# Patient Record
Sex: Female | Born: 1990 | Hispanic: Yes | Marital: Single | Smoking: Never smoker
Health system: Southern US, Community
[De-identification: ages and names within clinical notes are randomized; demographics above are authoritative.]

## PROBLEM LIST (undated history)

## (undated) ENCOUNTER — Inpatient Hospital Stay (HOSPITAL_COMMUNITY): Payer: Self-pay

## (undated) DIAGNOSIS — I10 Essential (primary) hypertension: Secondary | ICD-10-CM

## (undated) HISTORY — PX: APPENDECTOMY: SHX54

---

## 2016-09-28 ENCOUNTER — Encounter (HOSPITAL_COMMUNITY): Payer: Self-pay | Admitting: Emergency Medicine

## 2016-09-28 ENCOUNTER — Emergency Department (HOSPITAL_COMMUNITY)
Admission: EM | Admit: 2016-09-28 | Discharge: 2016-09-28 | Disposition: A | Discharge status: Home / Self Care | Payer: Self-pay | Attending: Dermatology | Admitting: Dermatology

## 2016-09-28 DIAGNOSIS — R1032 Left lower quadrant pain: Secondary | ICD-10-CM | POA: Insufficient documentation

## 2016-09-28 DIAGNOSIS — Z5321 Procedure and treatment not carried out due to patient leaving prior to being seen by health care provider: Secondary | ICD-10-CM | POA: Insufficient documentation

## 2016-09-28 DIAGNOSIS — I10 Essential (primary) hypertension: Secondary | ICD-10-CM | POA: Insufficient documentation

## 2016-09-28 HISTORY — DX: Essential (primary) hypertension: I10

## 2016-09-28 NOTE — ED Triage Notes (Signed)
Pt c/o L sided groin pain that started after picking up gallon jugs. Pt unsure if she is pregnant.

## 2016-09-29 ENCOUNTER — Encounter (HOSPITAL_COMMUNITY): Payer: Self-pay | Admitting: Emergency Medicine

## 2016-09-29 ENCOUNTER — Emergency Department (HOSPITAL_COMMUNITY)
Admission: EM | Admit: 2016-09-29 | Discharge: 2016-09-29 | Discharge status: Against Medical Advice | Payer: Self-pay | Attending: Emergency Medicine | Admitting: Emergency Medicine

## 2016-09-29 ENCOUNTER — Emergency Department (HOSPITAL_COMMUNITY): Payer: Self-pay

## 2016-09-29 DIAGNOSIS — R102 Pelvic and perineal pain: Secondary | ICD-10-CM | POA: Insufficient documentation

## 2016-09-29 DIAGNOSIS — O26899 Other specified pregnancy related conditions, unspecified trimester: Secondary | ICD-10-CM

## 2016-09-29 DIAGNOSIS — I10 Essential (primary) hypertension: Secondary | ICD-10-CM | POA: Insufficient documentation

## 2016-09-29 DIAGNOSIS — Z3A01 Less than 8 weeks gestation of pregnancy: Secondary | ICD-10-CM | POA: Insufficient documentation

## 2016-09-29 DIAGNOSIS — O3680X0 Pregnancy with inconclusive fetal viability, not applicable or unspecified: Secondary | ICD-10-CM

## 2016-09-29 DIAGNOSIS — O26891 Other specified pregnancy related conditions, first trimester: Secondary | ICD-10-CM | POA: Insufficient documentation

## 2016-09-29 LAB — CBC WITH DIFFERENTIAL/PLATELET
BASOS ABS: 0 10*3/uL (ref 0.0–0.1)
Basophils Relative: 0 %
Eosinophils Absolute: 0.1 10*3/uL (ref 0.0–0.7)
Eosinophils Relative: 1 %
HEMATOCRIT: 38.2 % (ref 36.0–46.0)
Hemoglobin: 12.5 g/dL (ref 12.0–15.0)
LYMPHS ABS: 2.5 10*3/uL (ref 0.7–4.0)
LYMPHS PCT: 28 %
MCH: 27.9 pg (ref 26.0–34.0)
MCHC: 32.7 g/dL (ref 30.0–36.0)
MCV: 85.3 fL (ref 78.0–100.0)
MONO ABS: 0.5 10*3/uL (ref 0.1–1.0)
Monocytes Relative: 5 %
NEUTROS ABS: 6.1 10*3/uL (ref 1.7–7.7)
Neutrophils Relative %: 66 %
Platelets: 258 10*3/uL (ref 150–400)
RBC: 4.48 MIL/uL (ref 3.87–5.11)
RDW: 14.2 % (ref 11.5–15.5)
WBC: 9.1 10*3/uL (ref 4.0–10.5)

## 2016-09-29 LAB — BASIC METABOLIC PANEL
ANION GAP: 6 (ref 5–15)
BUN: 13 mg/dL (ref 6–20)
CHLORIDE: 104 mmol/L (ref 101–111)
CO2: 26 mmol/L (ref 22–32)
Calcium: 8.9 mg/dL (ref 8.9–10.3)
Creatinine, Ser: 0.61 mg/dL (ref 0.44–1.00)
GFR calc non Af Amer: >60 mL/min (ref >60)
GLUCOSE: 91 mg/dL (ref 65–99)
Potassium: 4 mmol/L (ref 3.5–5.1)
Sodium: 136 mmol/L (ref 135–145)

## 2016-09-29 LAB — URINALYSIS, ROUTINE W REFLEX MICROSCOPIC
BILIRUBIN URINE: NEGATIVE
Glucose, UA: NEGATIVE mg/dL
HGB URINE DIPSTICK: NEGATIVE
KETONES UR: NEGATIVE mg/dL
Leukocytes, UA: NEGATIVE
Nitrite: NEGATIVE
PROTEIN: NEGATIVE mg/dL
SPECIFIC GRAVITY, URINE: 1.025 (ref 1.005–1.030)
pH: 6 (ref 5.0–8.0)

## 2016-09-29 LAB — WET PREP, GENITAL
SPERM: NONE SEEN
TRICH WET PREP: NONE SEEN
Yeast Wet Prep HPF POC: NONE SEEN

## 2016-09-29 LAB — ABO/RH: ABO/RH(D): O POS

## 2016-09-29 LAB — HCG, QUANTITATIVE, PREGNANCY: hCG, Beta Chain, Quant, S: 11796 m[IU]/mL — ABNORMAL HIGH (ref <5)

## 2016-09-29 LAB — POC URINE PREG, ED: PREG TEST UR: POSITIVE — AB

## 2016-09-29 NOTE — ED Notes (Signed)
Pt children given snacks.

## 2016-09-29 NOTE — ED Notes (Signed)
Pt explained that Dr. Despina HiddenEure was in a procedure at this time and was not able to perform consult until finished with procedure.  Dr. Patria Maneampos notified of pts concern for having to wait.

## 2016-09-29 NOTE — ED Provider Notes (Signed)
[facility] DEPT Provider Note   CSN: [id number] Arrival date & time: 09/29/16  4098     History   Chief Complaint Chief Complaint  Patient presents with  . Pelvic Pain    HPI Laura Bradford is a 25 y.o. female G2P1 presenting with lower left and midline pelvic pain along with yellow vaginal discharge and dysuria x 1 week.  She is pregnant as evidenced by 3 positive home pregnancy tests, her lmp was 08/05/16, although had a 2 day episode of heavy spotting in early September. She has not yet established prenatal care but plans to use the health department.  She reports her pelvic pain is constant, waxing and waning and has been present for the past week.  She believes lifting a heavy object may have triggered the start of pain. She denies fevers, chills, vomiting but endorses nausea and also feels lightheaded.    The history is provided by the patient. The history is limited by a language barrier (friend at bedside as interpreter as skype interpreter service not working.). A language interpreter was used.    Past Medical History:  Diagnosis Date  . Hypertension     There are no active problems to display for this patient.   Past Surgical History:  Procedure Laterality Date  . APPENDECTOMY    . CESAREAN SECTION      OB History    Gravida Para Term Preterm AB Living   1 1   1        SAB TAB Ectopic Multiple Live Births                   Home Medications    Prior to Admission medications   Medication Sig Start Date End Date Taking? Authorizing Provider  acetaminophen (TYLENOL) 500 MG tablet Take 500 mg by mouth every 6 (six) hours as needed.   Yes Historical Provider, MD    Family History No family history on file.  Social History Social History  Substance Use Topics  . Smoking status: Never Smoker  . Smokeless tobacco: Never Used  . Alcohol use No     Allergies   Review of patient's allergies indicates no known allergies.   Review of Systems Review  of Systems  Constitutional: Negative for fever.  HENT: Negative for congestion and sore throat.   Eyes: Negative.   Respiratory: Negative for chest tightness and shortness of breath.   Cardiovascular: Negative for chest pain.  Gastrointestinal: Positive for nausea. Negative for abdominal pain and vomiting.  Genitourinary: Positive for dysuria, pelvic pain and vaginal discharge. Negative for hematuria.  Musculoskeletal: Negative for arthralgias, joint swelling and neck pain.  Skin: Negative.  Negative for rash and wound.  Neurological: Negative for dizziness, weakness, light-headedness, numbness and headaches.  Psychiatric/Behavioral: Negative.      Physical Exam Updated Vital Signs BP 115/63 (BP Location: Left Arm)   Pulse 75   Temp 97.8 F (36.6 C) (Oral)   Resp 18   LMP 08/05/2016 Comment: spotted a couple of days on 9/10  SpO2 100%   Physical Exam  Constitutional: She appears well-developed and well-nourished.  HENT:  Head: Normocephalic and atraumatic.  Eyes: Conjunctivae are normal.  Neck: Normal range of motion.  Cardiovascular: Normal rate, regular rhythm, normal heart sounds and intact distal pulses.   Pulmonary/Chest: Effort normal and breath sounds normal. She has no wheezes.  Abdominal: Soft. Bowel sounds are normal. There is no tenderness.  Genitourinary: Vagina normal and uterus normal. Uterus is not tender.  Cervix exhibits no motion tenderness and no discharge. Right adnexum displays no mass, no tenderness and no fullness. Left adnexum displays tenderness. Left adnexum displays no mass and no fullness. No bleeding in the vagina. No vaginal discharge found.  Musculoskeletal: Normal range of motion.  Neurological: She is alert.  Skin: Skin is warm and dry.  Psychiatric: She has a normal mood and affect.  Nursing note and vitals reviewed.    ED Treatments / Results  Labs (all labs ordered are listed, but only abnormal results are displayed) Labs Reviewed  WET  PREP, GENITAL - Abnormal; Notable for the following:       Result Value   Clue Cells Wet Prep HPF POC PRESENT (*)    WBC, Wet Prep HPF POC MANY (*)    All other components within normal limits  HCG, QUANTITATIVE, PREGNANCY - Abnormal; Notable for the following:    hCG, Beta Chain, Quant, S 11,796 (*)    All other components within normal limits  URINALYSIS, ROUTINE W REFLEX MICROSCOPIC (NOT AT [facility]) - Abnormal; Notable for the following:    APPearance HAZY (*)    All other components within normal limits  POC URINE PREG, ED - Abnormal; Notable for the following:    Preg Test, Ur POSITIVE (*)    All other components within normal limits  CBC WITH DIFFERENTIAL/PLATELET  BASIC METABOLIC PANEL  HIV ANTIBODY (ROUTINE TESTING)  ABO/RH  GC/CHLAMYDIA PROBE AMP ([place]) NOT AT [facility]    EKG  EKG Interpretation None       Radiology [place] Ob Comp Less 14 Wks  Result Date: 09/29/2016 CLINICAL DATA:  Pelvic pain. EXAM: OBSTETRIC <14 WK US AND TRANSVAGINAL OB US TECHNIQUE: Both transabdominal and transvaginal ultrasound examinations were performed for complete evaluation of the gestation as well as the maternal uterus, adnexal regions, and pelvic cul-de-sac. Transvaginal technique was performed to assess early pregnancy. COMPARISON:  No prior. FINDINGS: Intrauterine gestational sac: Single Yolk sac:  Present Embryo:  Not visualized Cardiac Activity: Not visualized CRL:  0.9 cm 5 w   5 d                  US EDC: 05/27/2017 Subchorionic hemorrhage:  None visualized. Maternal uterus/adnexae: 2.2 x 2.1 x 2.2 cm complex right ovarian/ adnexal mass with associated blood flow. This may represent a large corpus luteal cyst, ectopic pregnancy, or ovarian/ paraovarian mass other etiology. Clinical in short-term interval ultrasound follow-up is suggested. No free pelvic fluid . IMPRESSION: 1. Probable early intrauterine pregnancy. Probable early intrauterine gestational sac, but no fetal pole, or cardiac  activity yet visualized. Recommend follow-up quantitative B-HCG levels and follow-up US in 14 days to confirm and assess viability. This recommendation follows SRU consensus guidelines: Diagnostic Criteria for Nonviable Pregnancy Early in the First Trimester. Malva Limes Engl J Med 2013; 161:0960-45369:1443-51. 2. 2.2 x 2.1 x 2.2 cm complex right ovarian/adnexal mass with associated blood flow. This may represent a PICC large corpus luteal cyst, ectopic pregnancy, or ovarian/paraovarian mass of other etiology. Clinical and short-term interval ultrasound follow-up suggested. Electronically Signed   By: Maisie Fushomas  Register   On: 09/29/2016 11:06   [place] Ob Transvaginal  Result Date: 09/29/2016 CLINICAL DATA:  Pelvic pain. EXAM: OBSTETRIC <14 WK US AND TRANSVAGINAL OB US TECHNIQUE: Both transabdominal and transvaginal ultrasound examinations were performed for complete evaluation of the gestation as well as the maternal uterus, adnexal regions, and pelvic cul-de-sac. Transvaginal technique was performed to assess early pregnancy. COMPARISON:  No prior.  FINDINGS: Intrauterine gestational sac: Single Yolk sac:  Present Embryo:  Not visualized Cardiac Activity: Not visualized CRL:  0.9 cm 5 w   5 d                  [place] EDC: 05/27/2017 Subchorionic hemorrhage:  None visualized. Maternal uterus/adnexae: 2.2 x 2.1 x 2.2 cm complex right ovarian/ adnexal mass with associated blood flow. This may represent a large corpus luteal cyst, ectopic pregnancy, or ovarian/ paraovarian mass other etiology. Clinical in short-term interval ultrasound follow-up is suggested. No free pelvic fluid . IMPRESSION: 1. Probable early intrauterine pregnancy. Probable early intrauterine gestational sac, but no fetal pole, or cardiac activity yet visualized. Recommend follow-up quantitative B-HCG levels and follow-up US in 14 days to confirm and assess viability. This recommendation follows SRU consensus guidelines: Diagnostic Criteria for Nonviable Pregnancy Early in the  First Trimester. Malva Limes Med 2013; 045:4098-11. 2. 2.2 x 2.1 x 2.2 cm complex right ovarian/adnexal mass with associated blood flow. This may represent a PICC large corpus luteal cyst, ectopic pregnancy, or ovarian/paraovarian mass of other etiology. Clinical and short-term interval ultrasound follow-up suggested. Electronically Signed   By: Maisie Fus  Register   On: 09/29/2016 11:06    Procedures Procedures (including critical care time)  Medications Ordered in ED Medications - No data to display   Initial Impression / Assessment and Plan / ED Course  I have reviewed the triage vital signs and the nursing notes.  Pertinent labs & imaging results that were available during my care of the patient were reviewed by me and considered in my medical decision making (see chart for details).  Clinical Course    Labs and [place] reviewed and call placed to Dr. Despina Hidden who was currently unavailable but would review the [place] and call back with recommendations.  Pt was very anxious about leaving and discussed with her several times re possible concern for ectopic pregnancy and that we need Dr. Forestine Chute advice prior to safely dispositioning to home.  After several conversations, pt and her friend left ama. She was advised to return here for any worsened sx, and recommended f/u with [facility] for a recheck.  Friend stated she would take her there tomorrow.  About 5 minutes after this,  Dr. Despina Hidden confirmed intrauterine pregnancy with yolk sac, fetal pole not yet present.  The right mass is definitely a hemorrhagic ovarian cyst, not an ectopic.  He advised repeat quant beta hcg in 3 days to confirm ongoing viable pregnancy.  Attempt to contact pt - voice mail, message left regarding calling me back regarding Dr.Eure's recs.   Final Clinical Impressions(s) / ED Diagnoses   Final diagnoses:  Pregnancy of unknown anatomic location    New Prescriptions Discharge Medication List as of 09/29/2016  2:14 PM         Burgess Amor, PA-C 09/29/16 1934    Azalia Bilis, MD 10/02/16 1023

## 2016-09-29 NOTE — ED Triage Notes (Addendum)
Pt reports pelvic pain x 1 week. Positive home pregnancy test x 3. Also reports dizziness/n. LMP-08/05/16-but reports spotting for 2 days on 08/23/16.

## 2016-09-29 NOTE — ED Notes (Addendum)
Pt children given snacks and books to read. Pt informed of delay.

## 2016-09-29 NOTE — ED Notes (Addendum)
Burgess AmorJulie idol, PA at bedside speaking to pt about delay.

## 2016-09-29 NOTE — ED Notes (Signed)
Pt left ama, even after counseling from doctor and PA discussing risks of leaving ama.

## 2016-09-29 NOTE — ED Notes (Signed)
Burgess AmorJulie Idol, PA notified that pt was getting anxious and wanted to speak to her.

## 2016-09-29 NOTE — ED Notes (Addendum)
Pt not found in room at this time, Dr. Patria Maneampos and Burgess AmorJulie Idol, PA notified. Pt will be marked as leaving AMA.

## 2016-09-30 LAB — GC/CHLAMYDIA PROBE AMP (~~LOC~~) NOT AT ARMC
Chlamydia: NEGATIVE
Neisseria Gonorrhea: NEGATIVE

## 2016-09-30 LAB — HIV ANTIBODY (ROUTINE TESTING W REFLEX): HIV Screen 4th Generation wRfx: NONREACTIVE

## 2016-10-05 ENCOUNTER — Inpatient Hospital Stay (HOSPITAL_COMMUNITY)
Admission: AD | Admit: 2016-10-05 | Discharge: 2016-10-05 | Disposition: A | Discharge status: Home / Self Care | Payer: Self-pay | Source: Ambulatory Visit | Attending: Family Medicine | Admitting: Family Medicine

## 2016-10-05 ENCOUNTER — Encounter (HOSPITAL_COMMUNITY): Payer: Self-pay | Admitting: *Deleted

## 2016-10-05 ENCOUNTER — Inpatient Hospital Stay (HOSPITAL_COMMUNITY): Payer: Self-pay

## 2016-10-05 DIAGNOSIS — O418X10 Other specified disorders of amniotic fluid and membranes, first trimester, not applicable or unspecified: Secondary | ICD-10-CM

## 2016-10-05 DIAGNOSIS — O208 Other hemorrhage in early pregnancy: Secondary | ICD-10-CM | POA: Insufficient documentation

## 2016-10-05 DIAGNOSIS — Z3A01 Less than 8 weeks gestation of pregnancy: Secondary | ICD-10-CM | POA: Insufficient documentation

## 2016-10-05 DIAGNOSIS — O468X1 Other antepartum hemorrhage, first trimester: Secondary | ICD-10-CM

## 2016-10-05 DIAGNOSIS — O26899 Other specified pregnancy related conditions, unspecified trimester: Secondary | ICD-10-CM

## 2016-10-05 DIAGNOSIS — R109 Unspecified abdominal pain: Secondary | ICD-10-CM | POA: Insufficient documentation

## 2016-10-05 LAB — URINE MICROSCOPIC-ADD ON

## 2016-10-05 LAB — URINALYSIS, ROUTINE W REFLEX MICROSCOPIC
Bilirubin Urine: NEGATIVE
GLUCOSE, UA: NEGATIVE mg/dL
Hgb urine dipstick: NEGATIVE
Ketones, ur: NEGATIVE mg/dL
Nitrite: NEGATIVE
PH: 6 (ref 5.0–8.0)
PROTEIN: NEGATIVE mg/dL
Specific Gravity, Urine: 1.025 (ref 1.005–1.030)

## 2016-10-05 LAB — COMPREHENSIVE METABOLIC PANEL
ALK PHOS: 51 U/L (ref 38–126)
ALT: 31 U/L (ref 14–54)
AST: 21 U/L (ref 15–41)
Albumin: 3.6 g/dL (ref 3.5–5.0)
Anion gap: 6 (ref 5–15)
BILIRUBIN TOTAL: 0.4 mg/dL (ref 0.3–1.2)
BUN: 10 mg/dL (ref 6–20)
CO2: 26 mmol/L (ref 22–32)
Calcium: 8.6 mg/dL — ABNORMAL LOW (ref 8.9–10.3)
Chloride: 105 mmol/L (ref 101–111)
Creatinine, Ser: 0.61 mg/dL (ref 0.44–1.00)
GLUCOSE: 92 mg/dL (ref 65–99)
POTASSIUM: 3.7 mmol/L (ref 3.5–5.1)
Sodium: 137 mmol/L (ref 135–145)
TOTAL PROTEIN: 6.8 g/dL (ref 6.5–8.1)

## 2016-10-05 LAB — CBC
HEMATOCRIT: 33.5 % — AB (ref 36.0–46.0)
HEMOGLOBIN: 11.2 g/dL — AB (ref 12.0–15.0)
MCH: 27.9 pg (ref 26.0–34.0)
MCHC: 33.4 g/dL (ref 30.0–36.0)
MCV: 83.3 fL (ref 78.0–100.0)
Platelets: 221 10*3/uL (ref 150–400)
RBC: 4.02 MIL/uL (ref 3.87–5.11)
RDW: 14.4 % (ref 11.5–15.5)
WBC: 8 10*3/uL (ref 4.0–10.5)

## 2016-10-05 LAB — HCG, QUANTITATIVE, PREGNANCY: HCG, BETA CHAIN, QUANT, S: 38809 m[IU]/mL — AB (ref <5)

## 2016-10-05 MED ORDER — PRENATAL VITAMINS 28-0.8 MG PO TABS: 1.0000 | ORAL_TABLET | Freq: Every day | ORAL | 3 refills | Status: AC

## 2016-10-05 NOTE — Discharge Instructions (Signed)
Dolor abdominal en el embarazo (Abdominal Pain During Pregnancy) El dolor abdominal es frecuente durante el embarazo. Generalmente no causa ningn dao. El dolor abdominal puede tener numerosas causas. Algunas causas son ms graves que otras. Ciertas causas de dolor abdominal durante el embarazo se diagnostican fcilmente. A veces, se tarda un tiempo para llegar al diagnstico. Otras veces la causa no se conoce. El dolor abdominal puede estar relacionado con [place]una alteracin del Rinconembarazo, o puede deberse a una causa totalmente diferente. Por este motivo, siempre consulte a su mdico cuando sienta molestias abdominales. INSTRUCCIONES PARA EL CUIDADO EN EL HOGAR  Est atenta al dolor para ver si hay cambios. Las siguientes indicaciones ayudarn a Psychologist, educationalaliviar cualquier Longs Drug Storesmolestia que pueda sentir:  No Chiropodisttenga relaciones sexuales y no coloque nada dentro de la vagina hasta que los sntomas hayan desaparecido completamente.  Descanse todo lo que pueda RadioShackhasta que el dolor se le haya calmado.  Si siente nuseas, beba lquidos claros. Evite los alimentos slidos mientras sienta malestar o tenga nuseas.  Tome slo medicamentos de venta libre o recetados, segn las indicaciones del mdico.  Cumpla con todas las visitas de control, segn le indique su mdico. SOLICITE ATENCIN MDICA DE INMEDIATO SI:  Tiene un sangrado, prdida de lquidos o elimina tejidos por la vagina.  El dolor o los clicos West Bountifulaumentan.  Tiene vmitos persistentes.  Comienza a Financial risk analystsentir dolor al orinar u Centex Corporationobserva sangre.  Tiene fiebre.  Nota que los movimientos del beb disminuyen.  Siente intensa debilidad o se marea.  Tiene dificultad para respirar con o sin dolor abdominal.  Siente un dolor de cabeza intenso junto al dolor abdominal.  Shelle Ironiene una secrecin vaginal anormal con dolor abdominal.  Tiene diarrea persistente.  El dolor abdominal sigue o empeora an despus de Field seismologisthacer reposo. ASEGRESE DE QUE:   Comprende estas  instrucciones.  Controlar su afeccin.  Recibir ayuda de inmediato si no mejora o si empeora.   Esta informacin no tiene Theme park managercomo fin reemplazar el consejo del mdico. Asegrese de hacerle al mdico cualquier pregunta que tenga.   Document Released: 11/30/2005 Document Revised: 09/20/2013 Elsevier Interactive Patient Education 2016 Elsevier Inc. Hematoma subcorinico (Subchorionic Hematoma) Un hematoma subcorinico es una acumulacin de sangre entre la pared externa de la placenta y la pared interna del la matriz (tero). La placenta es el rgano que conecta el feto a la pared del tero. La placenta realiza la funcin de alimentacin, respiracin (oxgeno al feto) y el trabajo de eliminacin de desechos (excrecin) del feto.  Un hematoma subcorinico es la anormalidad ms frecuente encontrada en una ecografa durante el primer trimestre o principios del segundo trimestre del embarazo. Si ha habido poca o ninguna hemorragia vaginal, generalmente los pequeos hematomas se reducen por su propia cuenta y no afectan al beb ni al Vanetta Muldersembarazo. La sangre es absorbida gradualmente durante una o Ravendendos semanas. Cuando la hemorragia comienza ms tarde en el embarazo o el hematoma es ms grande o se produce en una paciente de edad avanzada, el resultado puede no ser tan bueno. Los grandes hematomas pueden agrandarse an ms y Lesothoaumenta las posibilidades de aborto espontneo. El hematoma subcorinico tambin aumenta el riesgo de desprendimiento precoz de la placenta del tero, muerte fetal y Sport and exercise psychologistparto prematuro. INSTRUCCIONES PARA EL CUIDADO EN EL HOGAR   Repose en cama si el mdico se lo recomienda. Aunque el reposo en cama no evitar la hemorragia o un aborto espontneo, su mdico puede recomendarlo.  Evite levantar objetos pesados (ms de 10 libras [4,5 kg]), hacer  ejercicio, tener relaciones sexuales o realizar duchas vaginales segn se lo indique el profesional.  Lleve un registro de la cantidad y Hydrographic surveyor de remojo  (saturacin) de las toallas higinicas que Landscape architect. Anote esta informacin.  No use tampones.  Cumpla con todas las visitas de control, segn le indique su mdico. El profesional podr pedirle que se realice anlisis de seguimiento, pruebas de [place] o [place]. SOLICITE ATENCIN MDICA DE INMEDIATO SI:   Siente calambres intensos en el estmago, en la espalda, en el abdomen o en la pelvis.  Tiene fiebre.  Elimina cogulos o tejidos grandes. Guarde los tejidos para que su mdico los vea.  Si la hemorragia aumenta o siente mareos, debilidad o tiene episodios de [place].   Esta informacin no tiene Theme park manager el consejo del mdico. Asegrese de hacerle al mdico cualquier pregunta que tenga.   Document Released: 03/18/2009 Document Revised: 09/20/2013 Elsevier Interactive Patient Education [employer]

## 2016-10-05 NOTE — MAU Note (Signed)
Patient [place] was [place] in [place] Tuesday and was told she had an ectopic pregnancy and was told to come to MAU.  She did not have childcare until now so she presents today with continued pain on LL side of abdomen.  Denies vaginal bleeding.

## 2016-10-05 NOTE — MAU Provider Note (Signed)
History     CSN: [id number]  Arrival date and time: 10/05/16 4492   First Provider Initiated Contact with Patient 10/05/16 [phone]      Chief Complaint  Patient presents with  . aborting   HPI   In house spanish interpretor used:  Laura Bradford is a 25 y.o. female G50P0101 @ 27w1dhere in MAU with concerns about her pregnancy. [facility] She was told before she left the hospital that she could have an ectopic pregnancy and that she needed close follow-up. She has had trouble with child care and has not been able to follow up.   The pain comes and goes and is cramp like. She denies vaginal bleeding. The pain is worse in the LLQ.   OB History    Gravida Para Term Preterm AB Living   2 1 0 1   1   SAB TAB Ectopic Multiple Live Births           1      Past Medical History:  Diagnosis Date  . Hypertension     Past Surgical History:  Procedure Laterality Date  . APPENDECTOMY    . CESAREAN SECTION      No family history on file.  Social History  Substance Use Topics  . Smoking status: Never Smoker  . Smokeless tobacco: Never Used  . Alcohol use No    Allergies: No Known Allergies  Prescriptions Prior to Admission  Medication Sig Dispense Refill Last Dose  . acetaminophen (TYLENOL) 500 MG tablet Take 500 mg by mouth every 6 (six) hours as needed.   Past Week at Unknown time   Results for orders placed or performed during the hospital encounter of 10/05/16 (from the past 48 hour(s))  [facility]inal[facility], [facility]     Status: Abnormal   Collection Time: 10/05/16  9:05 AM  Result Value Ref Range   Color, Urine YELLOW YELLOW   APPearance CLEAR CLEAR   Specific Gravity, Urine 1.025 1.005 - 1.030   pH 6.0 5.0 - 8.0   Glucose, UA NEGATIVE NEGATIVE mg/dL   Hgb urine dipstick NEGATIVE NEGATIVE   Bilirubin Urine NEGATIVE  NEGATIVE   Ketones, ur NEGATIVE NEGATIVE mg/dL   Protein, ur NEGATIVE NEGATIVE mg/dL   Nitrite NEGATIVE NEGATIVE   Leukocytes, UA TRACE (A) NEGATIVE  Urine microscopic-add on     Status: Abnormal   Collection Time: 10/05/16  9:05 AM  Result Value Ref Range   Squamous Epithelial / LPF TOO NUMEROUS TO COUNT (A) NONE SEEN   WBC, UA 0-5 0 - 5 WBC/hpf   RBC / HPF 0-5 0 - 5 RBC/hpf   Bacteria, UA MANY (A) NONE SEEN   Urine-Other MUCOUS PRESENT   CBC     Status: Abnormal   Collection Time: 10/05/16  9:48 AM  Result Value Ref Range   WBC 8.0 4.0 - 10.5 K/uL   RBC 4.02 3.87 - 5.11 MIL/uL   Hemoglobin 11.2 (L) 12.0 - 15.0 g/dL   HCT 33.5 (L) 36.0 - 46.0 %   MCV 83.3 78.0 - 100.0 fL   MCH 27.9 26.0 - 34.0 pg   MCHC 33.4 30.0 - 36.0 g/dL   RDW 14.4 11.5 - 15.5 %   Platelets 221 150 - 400 K/uL  hCG, quantitative, pregnancy     Status: Abnormal  Collection Time: 10/05/16  9:48 AM  Result Value Ref Range   hCG, Beta Chain, Quant, S 38,809 (H) <5 mIU/mL    Comment:          GEST. AGE      CONC.  (mIU/mL)   <=1 WEEK        5 - 50     2 WEEKS       50 - 500     3 WEEKS       100 - 10,000     4 WEEKS     1,000 - 30,000     5 WEEKS     3,500 - 115,000   6-8 WEEKS     12,000 - 270,000    12 WEEKS     15,000 - 220,000        FEMALE AND NON-PREGNANT FEMALE:     LESS THAN 5 mIU/mL   Comprehensive metabolic panel     Status: Abnormal   Collection Time: 10/05/16  9:48 AM  Result Value Ref Range   Sodium 137 135 - 145 mmol/L   Potassium 3.7 3.5 - 5.1 mmol/L   Chloride 105 101 - 111 mmol/L   CO2 26 22 - 32 mmol/L   Glucose, Bld 92 65 - 99 mg/dL   BUN 10 6 - 20 mg/dL   Creatinine, Ser 0.61 0.44 - 1.00 mg/dL   Calcium 8.6 (L) 8.9 - 10.3 mg/dL   Total Protein 6.8 6.5 - 8.1 g/dL   Albumin 3.6 3.5 - 5.0 g/dL   AST 21 15 - 41 U/L   ALT 31 14 - 54 U/L   Alkaline Phosphatase 51 38 - 126 U/L   Total Bilirubin 0.4 0.3 - 1.2 mg/dL   GFR calc non Af Amer >60 >60 mL/min   GFR calc Af Amer >60 >60  mL/min    Comment: (NOTE) The eGFR has been calculated using the CKD EPI equation. This calculation has not been validated in all clinical situations. eGFR's persistently <60 mL/min signify possible Chronic Kidney Disease.    Anion gap 6 5 - 15    US Ob Transvaginal  Result Date: 10/05/2016 CLINICAL DATA:  Abdominal pain in pregnancy EXAM: TRANSVAGINAL OB ULTRASOUND TECHNIQUE: Transvaginal ultrasound was performed for complete evaluation of the gestation as well as the maternal uterus, adnexal regions, and pelvic cul-de-sac. COMPARISON:  09/29/2016 FINDINGS: Intrauterine gestational sac: Single Yolk sac:  Visualized Embryo:  Visualized Cardiac Activity: Visualized Heart Rate: 118 bpm MSD:   mm    w     d CRL:   3.2  mm   5 w 6 d                  [place] EDC: 06/01/2017 Subchorionic hemorrhage:  Small subchorionic hemorrhage Maternal uterus/adnexae: No adnexal masses or free fluid. IMPRESSION: Single intrauterine pregnancy with fetal heart rate 118 beats per minute. Small subchorionic hemorrhage. Electronically Signed   By: Rolm Baptise M.D.   On: 10/05/2016 10:32    Review of Systems  Constitutional: Negative for chills and fever.  Gastrointestinal: Positive for abdominal pain and nausea. Negative for constipation, diarrhea and vomiting.  Genitourinary: Negative for dysuria, frequency and urgency.   Physical Exam   Blood pressure 128/75, pulse 70, temperature 98.7 F (37.1 C), temperature source Oral, resp. rate 16, last menstrual period 08/23/2016, SpO2 100 %.  Physical Exam  Constitutional: She is oriented to person, place, and time. She appears well-developed and well-nourished. No distress.  HENT:  Head: Normocephalic.  Eyes: Pupils are equal, round, and reactive to light.  GI: Normal appearance. There is tenderness in the suprapubic area and left lower quadrant. There is no rigidity, no rebound and no guarding.  Musculoskeletal: Normal range of motion.  Neurological: She is alert and  oriented to person, place, and time.  Skin: Skin is warm. She is not diaphoretic.  Psychiatric: Her behavior is normal.    MAU Course  Procedures  None  MDM  We repeated US today CBC, Hcg  Assessment and Plan   A:  1. Subchorionic hematoma in first trimester, single or unspecified fetus   2. Abdominal pain in pregnancy, antepartum     P:  Discharge home in stable condition  Discussed [place] with the patient in detail with spanish interpretor  Start prenatal care Rx: Prenatal vitamins  Return to MAU as needed if symptoms worsen    Lezlie Lye, NP 10/05/2016 4:30 PM

## 2016-10-05 NOTE — MAU Note (Signed)
Urine in lab 

## 2016-11-17 ENCOUNTER — Encounter (HOSPITAL_COMMUNITY): Payer: Self-pay

## 2016-11-17 ENCOUNTER — Inpatient Hospital Stay (HOSPITAL_COMMUNITY)
Admission: AD | Admit: 2016-11-17 | Discharge: 2016-11-17 | Disposition: A | Discharge status: Home / Self Care | Payer: Self-pay | Source: Ambulatory Visit | Attending: Obstetrics & Gynecology | Admitting: Obstetrics & Gynecology

## 2016-11-17 DIAGNOSIS — O26891 Other specified pregnancy related conditions, first trimester: Secondary | ICD-10-CM | POA: Insufficient documentation

## 2016-11-17 DIAGNOSIS — M549 Dorsalgia, unspecified: Secondary | ICD-10-CM | POA: Insufficient documentation

## 2016-11-17 DIAGNOSIS — R109 Unspecified abdominal pain: Secondary | ICD-10-CM

## 2016-11-17 DIAGNOSIS — R202 Paresthesia of skin: Secondary | ICD-10-CM | POA: Insufficient documentation

## 2016-11-17 DIAGNOSIS — O219 Vomiting of pregnancy, unspecified: Secondary | ICD-10-CM | POA: Insufficient documentation

## 2016-11-17 DIAGNOSIS — R1032 Left lower quadrant pain: Secondary | ICD-10-CM | POA: Insufficient documentation

## 2016-11-17 DIAGNOSIS — O26892 Other specified pregnancy related conditions, second trimester: Secondary | ICD-10-CM

## 2016-11-17 DIAGNOSIS — Z3A12 12 weeks gestation of pregnancy: Secondary | ICD-10-CM | POA: Insufficient documentation

## 2016-11-17 DIAGNOSIS — O9989 Other specified diseases and conditions complicating pregnancy, childbirth and the puerperium: Secondary | ICD-10-CM

## 2016-11-17 DIAGNOSIS — O99891 Other specified diseases and conditions complicating pregnancy: Secondary | ICD-10-CM

## 2016-11-17 DIAGNOSIS — R51 Headache: Secondary | ICD-10-CM | POA: Insufficient documentation

## 2016-11-17 DIAGNOSIS — R200 Anesthesia of skin: Secondary | ICD-10-CM | POA: Insufficient documentation

## 2016-11-17 LAB — CBC
HCT: 35.7 % — ABNORMAL LOW (ref 36.0–46.0)
Hemoglobin: 12.3 g/dL (ref 12.0–15.0)
MCH: 28.4 pg (ref 26.0–34.0)
MCHC: 34.5 g/dL (ref 30.0–36.0)
MCV: 82.4 fL (ref 78.0–100.0)
Platelets: 261 10*3/uL (ref 150–400)
RBC: 4.33 MIL/uL (ref 3.87–5.11)
RDW: 14.3 % (ref 11.5–15.5)
WBC: 9.5 10*3/uL (ref 4.0–10.5)

## 2016-11-17 LAB — COMPREHENSIVE METABOLIC PANEL
ALT: 27 U/L (ref 14–54)
AST: 25 U/L (ref 15–41)
Albumin: 3.7 g/dL (ref 3.5–5.0)
Alkaline Phosphatase: 51 U/L (ref 38–126)
Anion gap: 6 (ref 5–15)
BUN: 9 mg/dL (ref 6–20)
CHLORIDE: 105 mmol/L (ref 101–111)
CO2: 24 mmol/L (ref 22–32)
CREATININE: 0.48 mg/dL (ref 0.44–1.00)
Calcium: 9.1 mg/dL (ref 8.9–10.3)
GFR calc non Af Amer: >60 mL/min (ref >60)
Glucose, Bld: 91 mg/dL (ref 65–99)
Potassium: 3.5 mmol/L (ref 3.5–5.1)
SODIUM: 135 mmol/L (ref 135–145)
Total Bilirubin: 0.6 mg/dL (ref 0.3–1.2)
Total Protein: 7.3 g/dL (ref 6.5–8.1)

## 2016-11-17 LAB — URINALYSIS, ROUTINE W REFLEX MICROSCOPIC
Bilirubin Urine: NEGATIVE
Glucose, UA: NEGATIVE mg/dL
Hgb urine dipstick: NEGATIVE
Ketones, ur: 5 mg/dL — AB
Nitrite: NEGATIVE
PH: 6 (ref 5.0–8.0)
Protein, ur: NEGATIVE mg/dL
SPECIFIC GRAVITY, URINE: 1.024 (ref 1.005–1.030)

## 2016-11-17 LAB — WET PREP, GENITAL
CLUE CELLS WET PREP: NONE SEEN
SPERM: NONE SEEN
Trich, Wet Prep: NONE SEEN
Yeast Wet Prep HPF POC: NONE SEEN

## 2016-11-17 MED ORDER — ACETAMINOPHEN 325 MG PO TABS
650.0000 mg | ORAL_TABLET | Freq: Once | ORAL | Status: AC
  Administered 2016-11-17: 650 mg via ORAL
  Filled 2016-11-17: qty 2

## 2016-11-17 MED ORDER — PROMETHAZINE HCL 25 MG PO TABS: 12.5000 mg | ORAL_TABLET | Freq: Four times a day (QID) | ORAL | 2 refills | Status: DC | PRN

## 2016-11-17 NOTE — MAU Provider Note (Signed)
Chief Complaint: Abdominal Pain   First Provider Initiated Contact with Patient 11/17/16 1535      SUBJECTIVE HPI: Laura Bradford is a 25 y.o. G2P0101 at 628w2d by LMP who presents to maternity admissions reporting several symptoms all with onset 2 months ago: abdominal cramping mostly in LLQ worsening in last week, headaches off and on, back pain, and tingling of both lower legs and fatigue in her legs.  [facility] She reports bleeding 2 weeks ago that was scant but no bleeding since then.  She has not tried any treatments, nothing seems to make her symptoms better or worse. The abdominal pain is gradually worsening but other symptoms are the same since onset.  She reports some mild nausea without vomiting. She denies vaginal bleeding, vaginal itching/burning, urinary symptoms, dizziness, or fever/chills.    [facility].   HPI  Past Medical History:  Diagnosis Date  . Hypertension    Past Surgical History:  Procedure Laterality Date  . APPENDECTOMY    . CESAREAN SECTION     Social History   Social History  . Marital status: Single    Spouse name: N/A  . Number of children: N/A  . Years of education: N/A   Occupational History  . Not on file.   Social History Main Topics  . Smoking status: Never Smoker  . Smokeless tobacco: Never Used  . Alcohol use No  . Drug use: No  . Sexual activity: Yes   Other Topics Concern  . Not on file   Social History Narrative  . No narrative on file   No current facility-administered medications on file prior to encounter.    Current Outpatient Prescriptions on File Prior to Encounter  Medication Sig Dispense Refill  . acetaminophen (TYLENOL) 500 MG tablet Take 500 mg by mouth every 6 (six) hours as needed for moderate pain.     . Prenatal Vit-Fe Fumarate-FA (PRENATAL VITAMINS) 28-0.8 MG TABS Take 1 tablet by mouth daily. (Patient  not taking: Reported on 11/17/2016) 60 tablet 3   No Known Allergies  ROS:  Review of Systems  Constitutional: Positive for fatigue. Negative for chills and fever.  Respiratory: Negative for shortness of breath.   Cardiovascular: Negative for chest pain.  Gastrointestinal: Positive for abdominal pain and nausea. Negative for vomiting.  Genitourinary: Positive for pelvic pain. Negative for difficulty urinating, dysuria, flank pain, vaginal bleeding, vaginal discharge and vaginal pain.  Musculoskeletal: Positive for back pain.  Neurological: Positive for weakness and headaches. Negative for dizziness.  Psychiatric/Behavioral: Negative.      I have reviewed patient's Past Medical Hx, Surgical Hx, Family Hx, Social Hx, medications and allergies.   Physical Exam  Patient Vitals for the past 24 hrs:  BP Temp Temp src Pulse Resp SpO2  11/17/16 1539 - - - - - 100 %  11/17/16 1512 (!) 121/51 98 F (36.7 C) Oral 91 18 -   Constitutional: Well-developed, well-nourished female in no acute distress.  Cardiovascular: normal rate Respiratory: normal effort GI: Abd soft, non-tender. Pos BS x 4 MS: Extremities nontender, no edema, normal ROM Neurologic: Alert and oriented x 4.  GU: Neg CVAT.  PELVIC EXAM: Cervix pink, visually closed, without lesion, scant white creamy discharge, vaginal walls and external genitalia normal Bimanual exam: Cervix 0/long/high, firm, anterior, neg CMT, uterus nontender, ~12 week size, adnexa without tenderness, enlargement, or mass  FHT 160 by  doppler   LAB RESULTS Results for orders placed or performed during the hospital encounter of 11/17/16 (from the past 24 hour(s))  Urinalysis, Routine w reflex microscopic     Status: Abnormal   Collection Time: 11/17/16  3:00 PM  Result Value Ref Range   Color, Urine YELLOW YELLOW   APPearance CLOUDY (A) CLEAR   Specific Gravity, Urine 1.024 1.005 - 1.030   pH 6.0 5.0 - 8.0   Glucose, UA NEGATIVE NEGATIVE mg/dL    Hgb urine dipstick NEGATIVE NEGATIVE   Bilirubin Urine NEGATIVE NEGATIVE   Ketones, ur 5 (A) NEGATIVE mg/dL   Protein, ur NEGATIVE NEGATIVE mg/dL   Nitrite NEGATIVE NEGATIVE   Leukocytes, UA MODERATE (A) NEGATIVE   RBC / HPF 0-5 0 - 5 RBC/hpf   WBC, UA 0-5 0 - 5 WBC/hpf   Bacteria, UA RARE (A) NONE SEEN   Squamous Epithelial / LPF 6-30 (A) NONE SEEN   Mucous PRESENT   Wet prep, genital     Status: Abnormal   Collection Time: 11/17/16  3:41 PM  Result Value Ref Range   Yeast Wet Prep HPF POC NONE SEEN NONE SEEN   Trich, Wet Prep NONE SEEN NONE SEEN   Clue Cells Wet Prep HPF POC NONE SEEN NONE SEEN   WBC, Wet Prep HPF POC MANY (A) NONE SEEN   Sperm NONE SEEN     --/--/O POS (10/17 [id number])  IMAGING  Bedside US reveals IUP at 7127w6d by CRL, c/w EDD by LMP, normal FHR at 156, subjectively normal amniotic fluid  MAU Management/MDM: Ordered labs and reviewed results.  Bedside US normal, c/w EDD by LMP.  F/U with prenatal care as scheduled.  Treatments in MAU included Tylenol 650 mg PO x 1 which resolved pt headache. Rx for Phenergan for nausea, list of OTC medications given.  Pt stable at time of discharge.  ASSESSMENT  1. Abdominal pain during pregnancy in second trimester   2. Headache in pregnancy, antepartum, second trimester   3. Back pain affecting pregnancy in first trimester   4. Numbness and tingling of both legs   5. Nausea and vomiting during pregnancy prior to [redacted] weeks gestation    PLAN Discharge home    Medication List    TAKE these medications   acetaminophen 500 MG tablet Commonly known as:  TYLENOL Take 500 mg by mouth every 6 (six) hours as needed for moderate pain.   Prenatal Vitamins 28-0.8 MG Tabs Take 1 tablet by mouth daily.   promethazine 25 MG tablet Commonly known as:  PHENERGAN Take 0.5-1 tablets (12.5-25 mg total) by mouth every 6 (six) hours as needed for nausea.        Sharen CounterLisa Leftwich-Kirby Certified Nurse-Midwife 11/17/2016  4:19  PM
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
## 2016-11-17 NOTE — MAU Note (Signed)
Patient presents with c/o vaginal bleeding and lower abdominal pain for the past 2 weeks. Patient denies any vaginal bleeding currently she only bled for 3 days. Patient also states that she feels very tired

## 2016-11-18 LAB — GC/CHLAMYDIA PROBE AMP (~~LOC~~) NOT AT ARMC
CHLAMYDIA, DNA PROBE: NEGATIVE
NEISSERIA GONORRHEA: NEGATIVE

## 2016-12-25 ENCOUNTER — Emergency Department (HOSPITAL_COMMUNITY): Payer: Self-pay

## 2016-12-25 ENCOUNTER — Emergency Department (HOSPITAL_COMMUNITY)
Admission: EM | Admit: 2016-12-25 | Discharge: 2016-12-25 | Disposition: A | Discharge status: Home / Self Care | Payer: Self-pay | Attending: Emergency Medicine | Admitting: Emergency Medicine

## 2016-12-25 ENCOUNTER — Encounter (HOSPITAL_COMMUNITY): Payer: Self-pay | Admitting: Emergency Medicine

## 2016-12-25 DIAGNOSIS — I10 Essential (primary) hypertension: Secondary | ICD-10-CM | POA: Insufficient documentation

## 2016-12-25 DIAGNOSIS — Z3A19 19 weeks gestation of pregnancy: Secondary | ICD-10-CM | POA: Insufficient documentation

## 2016-12-25 DIAGNOSIS — R102 Pelvic and perineal pain: Secondary | ICD-10-CM

## 2016-12-25 DIAGNOSIS — O26899 Other specified pregnancy related conditions, unspecified trimester: Secondary | ICD-10-CM

## 2016-12-25 DIAGNOSIS — R1032 Left lower quadrant pain: Secondary | ICD-10-CM | POA: Insufficient documentation

## 2016-12-25 DIAGNOSIS — O26892 Other specified pregnancy related conditions, second trimester: Secondary | ICD-10-CM | POA: Insufficient documentation

## 2016-12-25 LAB — COMPREHENSIVE METABOLIC PANEL
ALBUMIN: 3.1 g/dL — AB (ref 3.5–5.0)
ALK PHOS: 52 U/L (ref 38–126)
ALT: 16 U/L (ref 14–54)
AST: 19 U/L (ref 15–41)
Anion gap: 5 (ref 5–15)
BUN: 5 mg/dL — ABNORMAL LOW (ref 6–20)
CALCIUM: 9.1 mg/dL (ref 8.9–10.3)
CO2: 25 mmol/L (ref 22–32)
CREATININE: 0.43 mg/dL — AB (ref 0.44–1.00)
Chloride: 105 mmol/L (ref 101–111)
GFR calc Af Amer: >60 mL/min (ref >60)
GFR calc non Af Amer: >60 mL/min (ref >60)
GLUCOSE: 91 mg/dL (ref 65–99)
Potassium: 3.7 mmol/L (ref 3.5–5.1)
SODIUM: 135 mmol/L (ref 135–145)
Total Bilirubin: 0.3 mg/dL (ref 0.3–1.2)
Total Protein: 6.5 g/dL (ref 6.5–8.1)

## 2016-12-25 LAB — WET PREP, GENITAL
Clue Cells Wet Prep HPF POC: NONE SEEN
SPERM: NONE SEEN
Trich, Wet Prep: NONE SEEN

## 2016-12-25 LAB — CBC
HCT: 31.8 % — ABNORMAL LOW (ref 36.0–46.0)
Hemoglobin: 11.3 g/dL — ABNORMAL LOW (ref 12.0–15.0)
MCH: 30.1 pg (ref 26.0–34.0)
MCHC: 35.5 g/dL (ref 30.0–36.0)
MCV: 84.6 fL (ref 78.0–100.0)
PLATELETS: 231 10*3/uL (ref 150–400)
RBC: 3.76 MIL/uL — ABNORMAL LOW (ref 3.87–5.11)
RDW: 14.2 % (ref 11.5–15.5)
WBC: 7.9 10*3/uL (ref 4.0–10.5)

## 2016-12-25 LAB — URINALYSIS, ROUTINE W REFLEX MICROSCOPIC
BILIRUBIN URINE: NEGATIVE
Glucose, UA: NEGATIVE mg/dL
HGB URINE DIPSTICK: NEGATIVE
Ketones, ur: NEGATIVE mg/dL
Leukocytes, UA: NEGATIVE
Nitrite: NEGATIVE
PROTEIN: NEGATIVE mg/dL
Specific Gravity, Urine: 1.008 (ref 1.005–1.030)
pH: 8 (ref 5.0–8.0)

## 2016-12-25 LAB — LIPASE, BLOOD: Lipase: 16 U/L (ref 11–51)

## 2016-12-25 MED ORDER — FLUCONAZOLE 150 MG PO TABS: 150.0000 mg | ORAL_TABLET | Freq: Once | ORAL | 0 refills | Status: AC

## 2016-12-25 MED ORDER — ACETAMINOPHEN 500 MG PO TABS
1000.0000 mg | ORAL_TABLET | Freq: Once | ORAL | Status: AC
  Administered 2016-12-25: 1000 mg via ORAL
  Filled 2016-12-25: qty 2

## 2016-12-25 NOTE — ED Provider Notes (Signed)
[facility]-EMER[facility]ENCY DEPT Provider Note   CSN: [id number] Arrival date & time: 12/25/16  1305     History   Chief Complaint Chief Complaint  Patient presents with  . Abdominal Pain    HPI Laura Bradford is a 26 y.o. female.  HPI  The patient is a 26 year old female, G2 T1 at approximately 17 weeks and 5 days by date of delivery, she presents to the hospital with a complaint of left-sided lower abdominal and pelvic discomfort with some associated dark colored urine. The patient and her friend with whom she lives and who provides translational services states that she has been in her usual state of health until recently when she developed some discomfort on the left side of her abdomen, she feels like the baby has dropped and has been having some discomfort in the left lower pelvis as well as in the back which has been coming on and off all day but has been persistent through the majority of that time. She did have one episode of vomiting last night, she denies objective fevers, she does report that her urine has been slightly dark colored and seems to be more dense sinking down through the water. She does recall that recently she was treated at the [facility] in [facility] for an infection, she takes it was a urine infection but she does not remember having any symptoms at that time  Past Medical History:  Diagnosis Date  . Hypertension     There are no active problems to display for this patient.   Past Surgical History:  Procedure Laterality Date  . APPENDECTOMY    . CESAREAN SECTION      OB History    Gravida Para Term Preterm AB Living   2 1 0 1   1   SAB TAB Ectopic Multiple Live Births           1       Home Medications    Prior to Admission medications   Medication Sig Start Date End Date Taking? Authorizing Provider  acetaminophen (TYLENOL) 500 MG tablet Take 500 mg by mouth every 6 (six) hours as needed for moderate pain.     Historical  Provider, MD  Prenatal Vit-Fe Fumarate-FA (PRENATAL VITAMINS) 28-0.8 MG TABS Take 1 tablet by mouth daily. Patient not taking: Reported on 11/17/2016 10/05/16   Duane Lope, NP  promethazine (PHENERGAN) 25 MG tablet Take 0.5-1 tablets (12.5-25 mg total) by mouth every 6 (six) hours as needed for nausea. 11/17/16   Hurshel Party, CNM    Family History No family history on file.  Social History Social History  Substance Use Topics  . Smoking status: Never Smoker  . Smokeless tobacco: Never Used  . Alcohol use No     Allergies   Patient has no known allergies.   Review of Systems Review of Systems  All other systems reviewed and are negative.    Physical Exam Updated Vital Signs BP 111/65 (BP Location: Left Arm)   Pulse 108   Temp 98.1 F (36.7 C) (Oral)   Resp 18   Ht 5\' 5"  (1.651 m)   Wt 141 lb 12.8 oz (64.3 kg)   LMP 08/23/2016 (Exact Date) Comment: spotted a couple of days on 9/10  SpO2 97%   BMI 23.60 kg/m   Physical Exam  Constitutional: She appears well-developed and well-nourished. No distress.  HENT:  Head: Normocephalic and atraumatic.  Mouth/Throat: Oropharynx is clear and moist. No oropharyngeal exudate.  Eyes: Conjunctivae and EOM are normal. Pupils are equal, round, and reactive to light. Right eye exhibits no discharge. Left eye exhibits no discharge. No scleral icterus.  Neck: Normal range of motion. Neck supple. No JVD present. No thyromegaly present.  Cardiovascular: Normal rate, regular rhythm, normal heart sounds and intact distal pulses.  Exam reveals no gallop and no friction rub.   No murmur heard. Pulmonary/Chest: Effort normal and breath sounds normal. No respiratory distress. She has no wheezes. She has no rales.  Abdominal: Soft. Bowel sounds are normal. She exhibits no distension and no mass. There is no tenderness.  Very soft abdomen, minimally tender along the left side of the abdomen, there is no guarding but she does wince  in discomfort with palpation of the left mid and left lower quadrant. Uterus minimally palpable  Genitourinary:  Genitourinary Comments: Chaperone present for pelvic exam, normal-appearing cervix, no blood in the vaginal vault, no foreign bodies, there is a moderate amount of cream to yellow colored discharge, there is no right adnexal tenderness or masses, there is mild cervical motion tenderness, there is left adnexal tenderness, there is no guarding, tenderness is mild  Musculoskeletal: Normal range of motion. She exhibits no edema or tenderness.  Lymphadenopathy:    She has no cervical adenopathy.  Neurological: She is alert. Coordination normal.  Skin: Skin is warm and dry. No rash noted. No erythema.  Psychiatric: She has a normal mood and affect. Her behavior is normal.  Nursing note and vitals reviewed.    ED Treatments / Results  Labs (all labs ordered are listed, but only abnormal results are displayed) Labs Reviewed  CBC - Abnormal; Notable for the following:       Result Value   RBC 3.76 (*)    Hemoglobin 11.3 (*)    HCT 31.8 (*)    All other components within normal limits  URINE CULTURE  LIPASE, BLOOD  COMPREHENSIVE METABOLIC PANEL  URINALYSIS, ROUTINE W REFLEX MICROSCOPIC     Radiology No results found.  Procedures Procedures (including critical care time)  Medications Ordered in ED Medications - No data to display   Initial Impression / Assessment and Plan / ED Course  I have reviewed the triage vital signs and the nursing notes.  Pertinent labs & imaging results that were available during my care of the patient were reviewed by me and considered in my medical decision making (see chart for details).  Clinical Course     Bedside ultrasound reveals a fetal heart rate of approximately 140 bpm, there is positive fetal movements several times per minute while the patient is being examined under ultrasound. The mother's abdomen is minimally tender along the  left side. She is at 17 weeks and 5 days by gestational age according to her initial ultrasound and her projected date of delivery. At this time we will proceed with an OB ultrasound, urinalysis, nursing has ordered labs prior to my examination. The patient does not appear to be in distress. She'll be given Tylenol for her abdominal discomfort.  D/w Dr. Despina HiddenEure - will see in office [place] US reviewed - no acute findings Stable for d/c.  Final Clinical Impressions(s) / ED Diagnoses   Final diagnoses:  None    New Prescriptions New Prescriptions   No medications on file     Eber HongBrian Zaion Hreha, MD 12/25/16 1907

## 2016-12-25 NOTE — Discharge Instructions (Signed)

## 2016-12-25 NOTE — ED Triage Notes (Signed)
Patient complains of abdominal pain that started last night. States belly feels hard and has not felt baby move in 2 days.

## 2016-12-27 LAB — URINE CULTURE: CULTURE: NO GROWTH

## 2016-12-28 LAB — GC/CHLAMYDIA PROBE AMP (~~LOC~~) NOT AT ARMC
Chlamydia: NEGATIVE
Neisseria Gonorrhea: NEGATIVE

## 2017-08-10 ENCOUNTER — Encounter (HOSPITAL_COMMUNITY): Payer: Self-pay

## 2018-12-02 IMAGING — US US OB LIMITED
1 series · 14 of 28 positions shown · non-contrast
Comparison: none

CLINICAL DATA: 25 y/o  F; left pelvic pain.

EXAM:
LIMITED OBSTETRIC ULTRASOUND

[Series 1: us ob limited · 0.19mm/px · 14 of 71 slices shown]
[im 3/71]
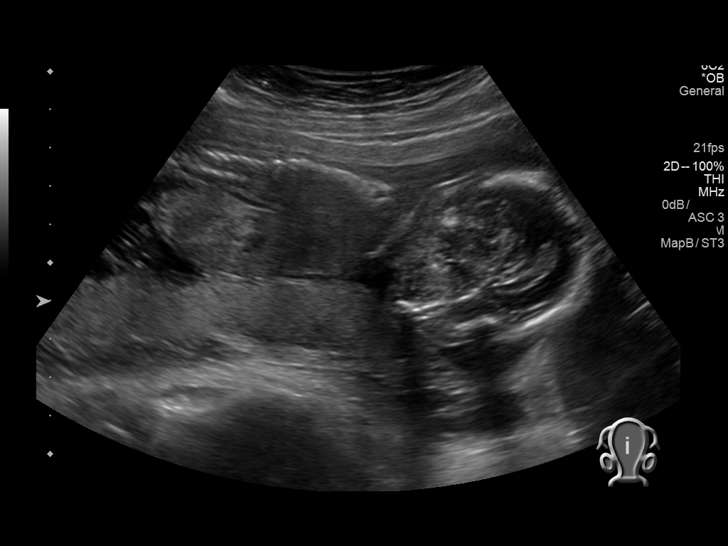
[im 8/71]
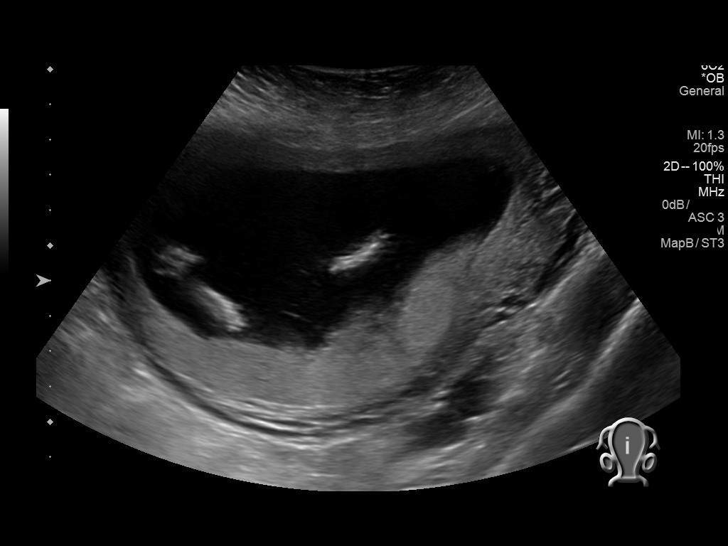
[im 13/71]
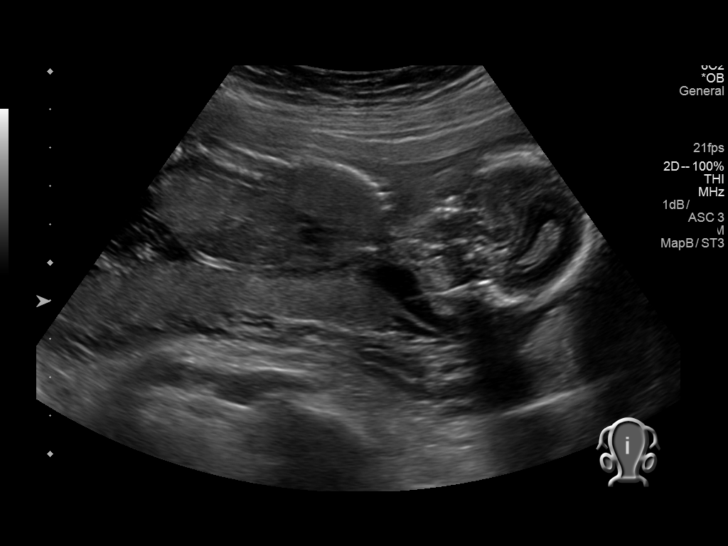
[im 19/71]
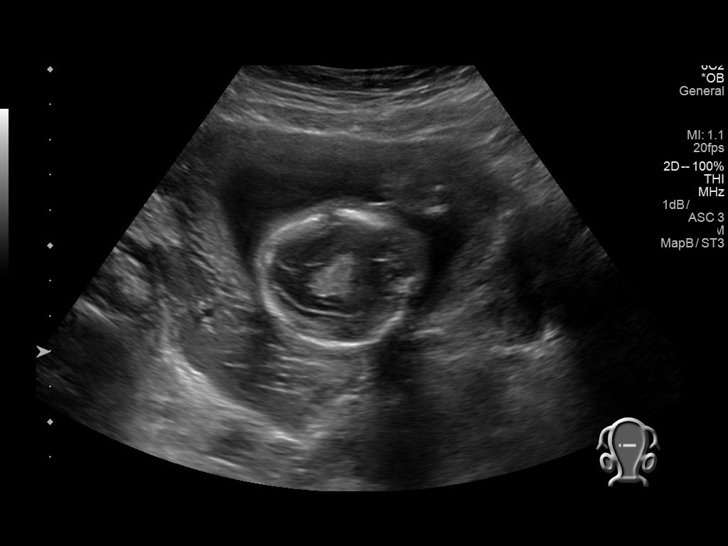
[im 24/71]
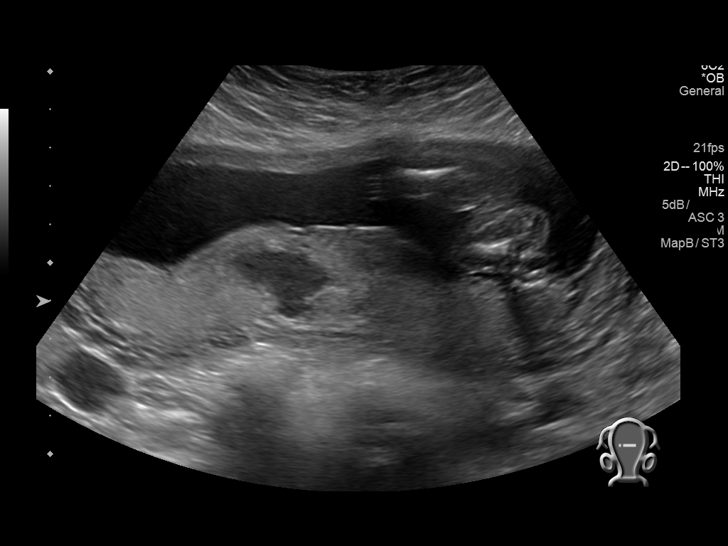
[im 29/71]
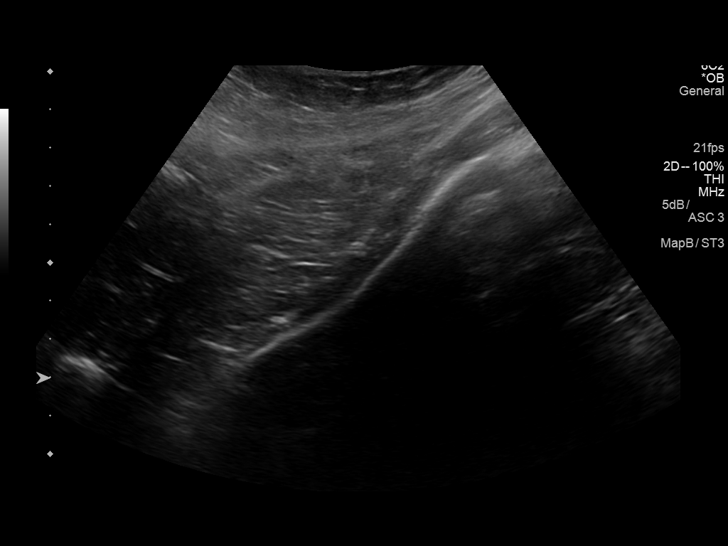
[im 34/71]
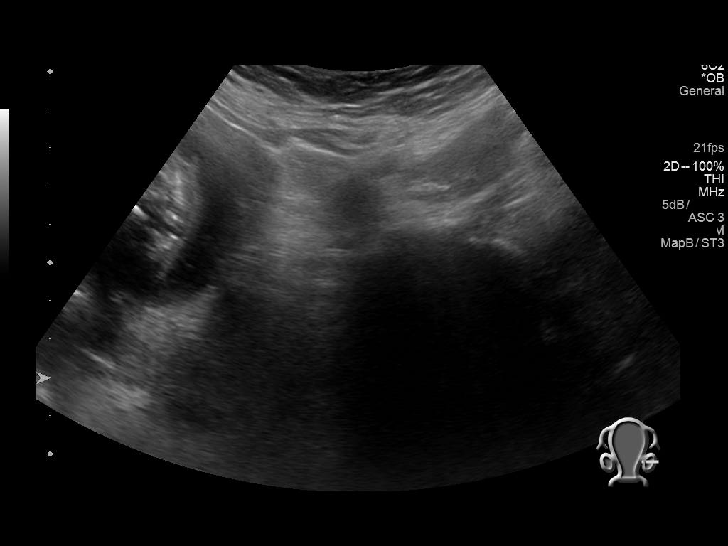
[im 39/71]
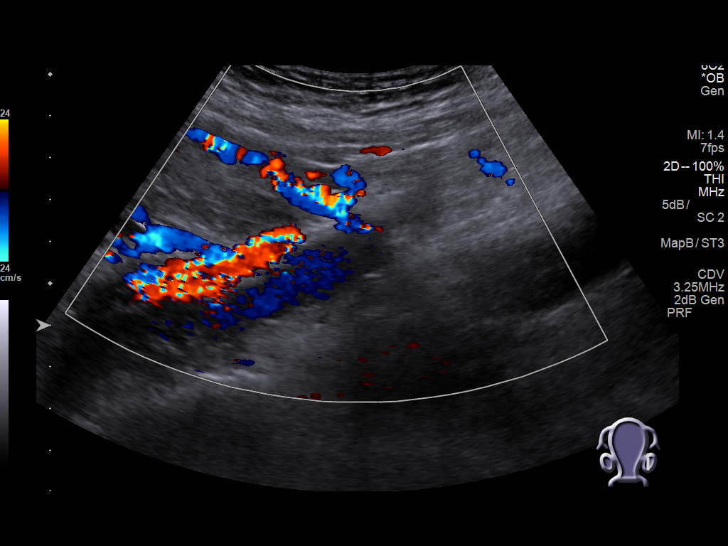
[im 45/71]
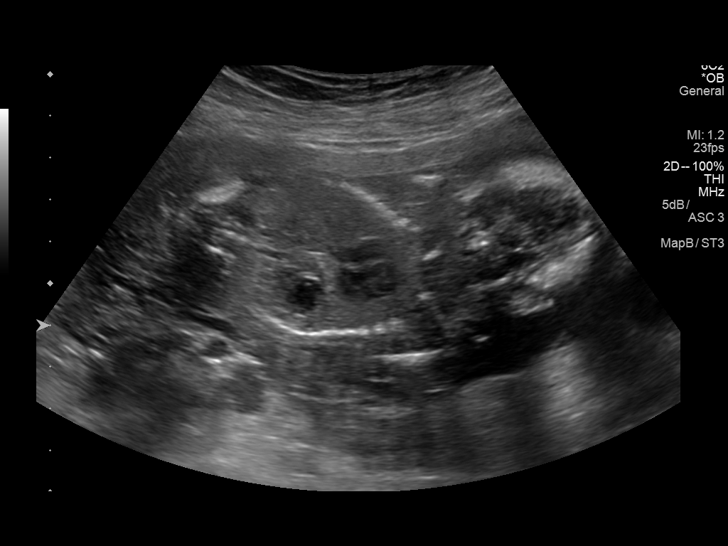
[im 50/71]
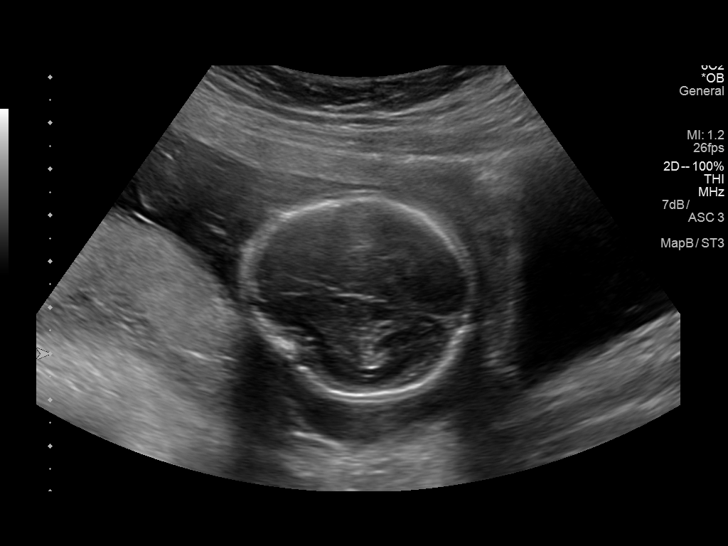
[im 55/71]
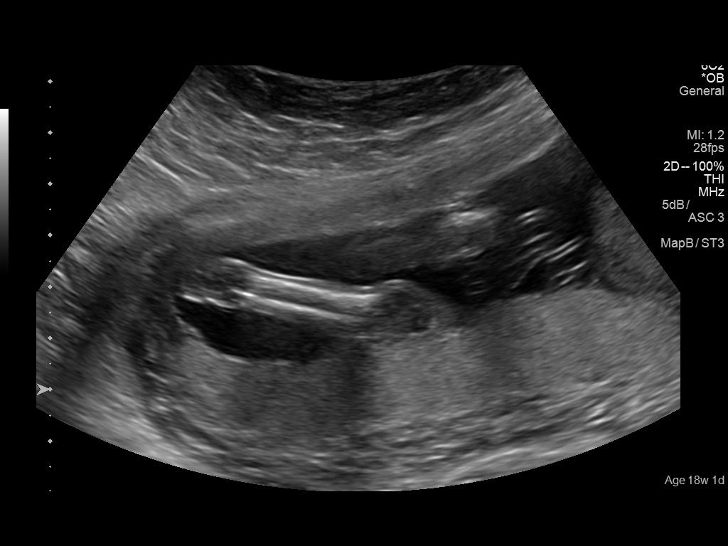
[im 60/71]
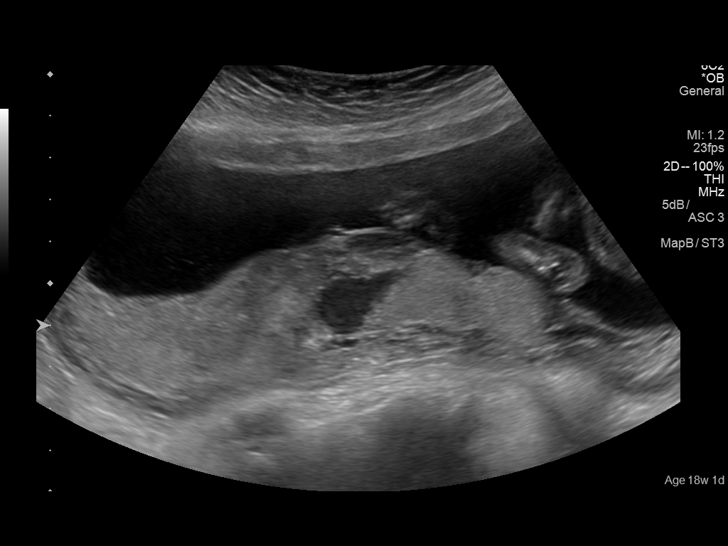
[im 65/71]
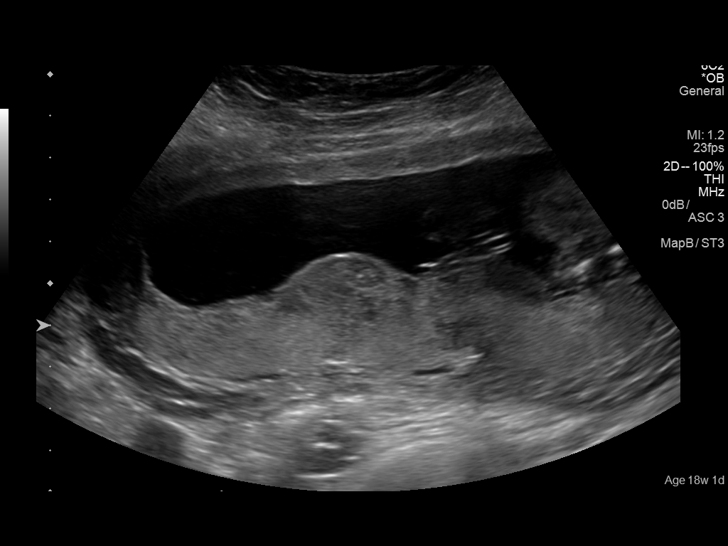
[im 71/71]
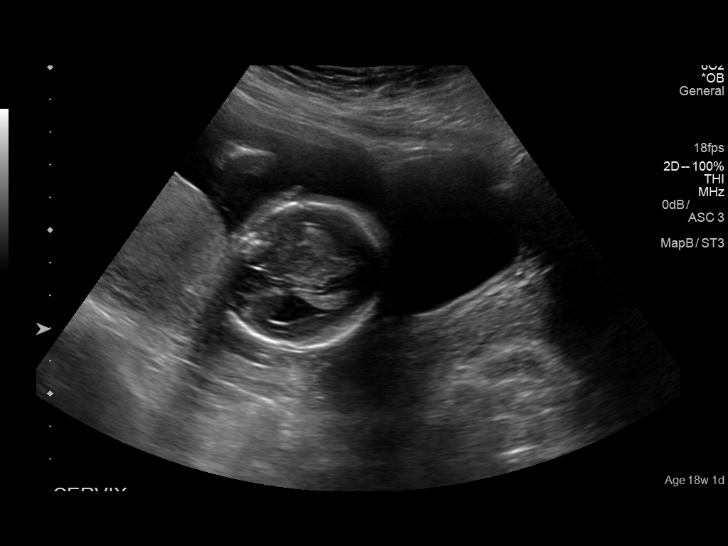

[14 of 28 positions shown; findings below may reference images not displayed]

FINDINGS: Number of Fetuses: 1

Heart Rate:  157 bpm

Movement: Yes

Presentation: Cephalic

Placental Location: Posterior

Previa: No

Amniotic Fluid (Subjective):  Within normal limits.

BPD:  4.31 cmcm 19w  0d

MATERNAL FINDINGS:

Cervix:  Appears closed.

Uterus/Adnexae:  Obscured by bowel gas.
IMPRESSION: Single live intrauterine pregnancy. No acute abnormality identified.
Adnexa poorly visualized due to overlying bowel gas.

This exam is performed on an emergent basis and does not
comprehensively evaluate fetal size, dating, or anatomy; follow-up
complete OB US should be considered if further fetal assessment is
warranted.

By: Arissa Billiot M.D.
